# Patient Record
Sex: Female | Born: 1955 | Race: White | Hispanic: No | Marital: Married | Smoking: Current some day smoker
Health system: Southern US, Community
[De-identification: ages and names within clinical notes are randomized; demographics above are authoritative.]

## PROBLEM LIST (undated history)

## (undated) DIAGNOSIS — N83209 Unspecified ovarian cyst, unspecified side: Secondary | ICD-10-CM

## (undated) DIAGNOSIS — F32A Depression, unspecified: Secondary | ICD-10-CM

## (undated) DIAGNOSIS — F329 Major depressive disorder, single episode, unspecified: Secondary | ICD-10-CM

## (undated) DIAGNOSIS — I10 Essential (primary) hypertension: Secondary | ICD-10-CM

## (undated) DIAGNOSIS — N840 Polyp of corpus uteri: Secondary | ICD-10-CM

## (undated) HISTORY — DX: Unspecified ovarian cyst, unspecified side: N83.209

## (undated) HISTORY — PX: COLONOSCOPY: SHX174

## (undated) HISTORY — DX: Polyp of corpus uteri: N84.0

---

## 2009-09-16 DIAGNOSIS — N83209 Unspecified ovarian cyst, unspecified side: Secondary | ICD-10-CM

## 2009-09-16 HISTORY — DX: Unspecified ovarian cyst, unspecified side: N83.209

## 2010-01-16 DIAGNOSIS — N840 Polyp of corpus uteri: Secondary | ICD-10-CM

## 2010-01-16 HISTORY — DX: Polyp of corpus uteri: N84.0

## 2010-01-16 HISTORY — PX: DIAGNOSTIC LAPAROSCOPY: SUR761

## 2011-03-19 ENCOUNTER — Emergency Department: Payer: Self-pay | Admitting: Emergency Medicine

## 2012-02-21 ENCOUNTER — Ambulatory Visit: Payer: Self-pay | Admitting: Family Medicine

## 2013-04-14 ENCOUNTER — Ambulatory Visit: Payer: Self-pay | Admitting: Physician Assistant

## 2015-10-04 ENCOUNTER — Emergency Department
Admission: EM | Admit: 2015-10-04 | Discharge: 2015-10-04 | Disposition: A | Discharge status: Home / Self Care | Payer: BC Managed Care – PPO | Attending: Emergency Medicine | Admitting: Emergency Medicine

## 2015-10-04 ENCOUNTER — Encounter: Payer: Self-pay | Admitting: Emergency Medicine

## 2015-10-04 DIAGNOSIS — R21 Rash and other nonspecific skin eruption: Secondary | ICD-10-CM | POA: Insufficient documentation

## 2015-10-04 DIAGNOSIS — I10 Essential (primary) hypertension: Secondary | ICD-10-CM | POA: Insufficient documentation

## 2015-10-04 DIAGNOSIS — R1030 Lower abdominal pain, unspecified: Secondary | ICD-10-CM | POA: Insufficient documentation

## 2015-10-04 DIAGNOSIS — R51 Headache: Secondary | ICD-10-CM | POA: Insufficient documentation

## 2015-10-04 DIAGNOSIS — R519 Headache, unspecified: Secondary | ICD-10-CM

## 2015-10-04 DIAGNOSIS — R1032 Left lower quadrant pain: Secondary | ICD-10-CM

## 2015-10-04 DIAGNOSIS — R197 Diarrhea, unspecified: Secondary | ICD-10-CM | POA: Insufficient documentation

## 2015-10-04 DIAGNOSIS — R1031 Right lower quadrant pain: Secondary | ICD-10-CM

## 2015-10-04 HISTORY — DX: Depression, unspecified: F32.A

## 2015-10-04 HISTORY — DX: Essential (primary) hypertension: I10

## 2015-10-04 HISTORY — DX: Major depressive disorder, single episode, unspecified: F32.9

## 2015-10-04 LAB — URINALYSIS COMPLETE WITH MICROSCOPIC (ARMC ONLY)
Bacteria, UA: NONE SEEN
Bilirubin Urine: NEGATIVE
GLUCOSE, UA: NEGATIVE mg/dL
KETONES UR: NEGATIVE mg/dL
Leukocytes, UA: NEGATIVE
NITRITE: NEGATIVE
PROTEIN: NEGATIVE mg/dL
SPECIFIC GRAVITY, URINE: 1.017 (ref 1.005–1.030)
pH: 5 (ref 5.0–8.0)

## 2015-10-04 LAB — COMPREHENSIVE METABOLIC PANEL
ALK PHOS: 84 U/L (ref 38–126)
ALT: 40 U/L (ref 14–54)
AST: 30 U/L (ref 15–41)
Albumin: 4.6 g/dL (ref 3.5–5.0)
Anion gap: 9 (ref 5–15)
BILIRUBIN TOTAL: 0.4 mg/dL (ref 0.3–1.2)
BUN: 18 mg/dL (ref 6–20)
CALCIUM: 9.9 mg/dL (ref 8.9–10.3)
CO2: 24 mmol/L (ref 22–32)
Chloride: 105 mmol/L (ref 101–111)
Creatinine, Ser: 0.73 mg/dL (ref 0.44–1.00)
GFR calc Af Amer: >60 mL/min (ref >60)
Glucose, Bld: 114 mg/dL — ABNORMAL HIGH (ref 65–99)
POTASSIUM: 4 mmol/L (ref 3.5–5.1)
Sodium: 138 mmol/L (ref 135–145)
TOTAL PROTEIN: 7.8 g/dL (ref 6.5–8.1)

## 2015-10-04 LAB — CBC
HCT: 45.1 % (ref 35.0–47.0)
Hemoglobin: 15.8 g/dL (ref 12.0–16.0)
MCH: 31 pg (ref 26.0–34.0)
MCHC: 35 g/dL (ref 32.0–36.0)
MCV: 88.5 fL (ref 80.0–100.0)
Platelets: 174 10*3/uL (ref 150–440)
RBC: 5.09 MIL/uL (ref 3.80–5.20)
RDW: 13.2 % (ref 11.5–14.5)
WBC: 7.9 10*3/uL (ref 3.6–11.0)

## 2015-10-04 LAB — LIPASE, BLOOD: Lipase: 36 U/L (ref 11–51)

## 2015-10-04 MED ORDER — DIPHENOXYLATE-ATROPINE 2.5-0.025 MG PO TABS
2.0000 | ORAL_TABLET | Freq: Once | ORAL | Status: AC
  Administered 2015-10-04: 2 via ORAL
  Filled 2015-10-04: qty 2

## 2015-10-04 MED ORDER — DIPHENHYDRAMINE HCL 25 MG PO CAPS: 25.0000 mg | ORAL_CAPSULE | Freq: Four times a day (QID) | ORAL | 0 refills | Status: DC | PRN

## 2015-10-04 MED ORDER — LOPERAMIDE HCL 2 MG PO TABS: 2.0000 mg | ORAL_TABLET | Freq: Four times a day (QID) | ORAL | 0 refills | Status: DC | PRN

## 2015-10-04 NOTE — ED Triage Notes (Signed)
Pt to ed with c/o abd pain and cramping and diarrhea since Friday.

## 2015-10-04 NOTE — ED Provider Notes (Signed)
[facility] Emergency Department Provider Note  ____________________________________________  Time seen: Approximately 4:33 PM  I have reviewed the triage vital signs and the nursing notes.   HISTORY  Chief Complaint Diarrhea    HPI Melinda Randall is a 60 y.o. female presenting w/ diarrhea, lower abdominal cramping, headache and rash.  The patient reports that 4 days ago she developed a large number of watery nonbloody stools associated with abdominal cramping and a headache. The abdominal cramping was in the lower abdomen in both the cramping and headache would resolve after bowel movements. Since then, the frequency of her diarrhea has improved, however she reports that every time she eats or drinks anything, "it runs right through me." She has not tried any antidiarrheal. Over the past 2 days, she is also noted an erythematous and very pruritic rash on the left shoulder under her bra strap. She denies any new detergents, soaps, changes in medications. She has tried calamine lotion without improvement, but Benadryl did help. No fevers, chills, nausea or vomiting. No recent travel outside the [place]. No known sick contacts.  Past Medical History:  Diagnosis Date  . Depression   . Hypertension     There are no active problems to display for this patient.   History reviewed. No pertinent surgical history.  Current Outpatient Rx  . Order #: [id number] Class: Print  . Order #: [id number] Class: Print    Allergies Review of patient's allergies indicates no known allergies.  History reviewed. No pertinent family history.  Social History Social History  Substance Use Topics  . Smoking status: Never Smoker  . Smokeless tobacco: Never Used  . Alcohol use No    Review of Systems Constitutional: No fever/chills. Eyes: No visual changes. ENT: No sore throat. No congestion or rhinorrhea. Cardiovascular: Denies chest pain. Denies  palpitations. Respiratory: Denies shortness of breath.  No cough. Gastrointestinal: Positive lower abdominal pain.  No nausea, no vomiting.  Positive watery diarrhea.  No constipation. Genitourinary: Negative for dysuria. Musculoskeletal: Negative for back pain. Skin: Positive for rash. Neurological: Negative for headaches. No focal numbness, tingling or weakness.   10-point ROS otherwise negative.  ____________________________________________   PHYSICAL EXAM:  VITAL SIGNS: ED Triage Vitals  Enc Vitals Group     BP 10/04/15 1405 (!) 136/99     Pulse Rate 10/04/15 1405 90     Resp 10/04/15 1405 18     Temp 10/04/15 1405 98.4 F (36.9 C)     Temp Source 10/04/15 1405 Oral     SpO2 10/04/15 1405 100 %     Weight 10/04/15 1405 146 lb (66.2 kg)     Height 10/04/15 1405 5\' 3"  (1.6 m)     Head Circumference --      Peak Flow --      Pain Score 10/04/15 1407 3     Pain Loc --      Pain Edu? --      Excl. in GC? --     Constitutional: Alert and oriented. Well appearing and in no acute distress. Answers questions appropriately. Eyes: Conjunctivae are normal.  EOMI. No scleral icterus. Head: Atraumatic. Nose: No congestion/rhinnorhea. Mouth/Throat: Mucous membranes are moist.  Neck: No stridor.  Supple.  No meningismus. Cardiovascular: Normal rate, regular rhythm. No murmurs, rubs or gallops.  Respiratory: Normal respiratory effort.  No accessory muscle use or retractions. Lungs CTAB.  No wheezes, rales or ronchi. Gastrointestinal: Obese. Soft, nontender and nondistended.  No tenderness at this time. No guarding  or rebound.  No peritoneal signs. Musculoskeletal: No LE edema.  Neurologic:  A&Ox3.  Speech is clear.  Face and smile are symmetric.  EOMI.  Moves all extremities well. Skin:  Skin is warm, dry and intact. Patient has an erythematous patch that is 4 x 2" on the left shoulder just below her bra strap without any vesicles, swelling, or discharge. Psychiatric: Mood and  affect are normal. Speech and behavior are normal.  Normal judgement.  ____________________________________________   LABS (all labs ordered are listed, but only abnormal results are displayed)  Labs Reviewed  COMPREHENSIVE METABOLIC PANEL - Abnormal; Notable for the following:       Result Value   Glucose, Bld 114 (*)    All other components within normal limits  URINALYSIS COMPLETEWITH MICROSCOPIC ([facility] ONLY) - Abnormal; Notable for the following:    Color, Urine YELLOW (*)    APPearance CLEAR (*)    Hgb urine dipstick 1+ (*)    Squamous Epithelial / LPF 0-5 (*)    All other components within normal limits  LIPASE, BLOOD  CBC   ____________________________________________  EKG  Not indicated ____________________________________________  RADIOLOGY  No results found.  ____________________________________________   PROCEDURES  Procedure(s) performed: None  Procedures  Critical Care performed: No ____________________________________________   INITIAL IMPRESSION / ASSESSMENT AND PLAN / ED COURSE  Pertinent labs & imaging results that were available during my care of the patient were reviewed by me and considered in my medical decision making (see chart for details).  60 y.o. female with 4 days of watery diarrhea that is improving in frequency, associated abdominal cramping, and rash. Overall, the patient is well-appearing with reassuring vital signs per she is afebrile. Her examination is reassuring and she has no focal pain that would be consistent with an acute surgical pathology or infectious etiology including diverticulitis. It is possible that she has a viral GI illness, or foodborne illness although this is less likely given the length of time that she has been having symptoms. Without fever, focal pain on exam, and normal white blood cell count, there is no indication for imaging at this time. I have given her return precautions. For the patient's rash, the  etiology is not quite clear, and I have recommended that she continue with calamine lotion as she has been using, and had Benadryl for it she noticed.  The patient will follow up with her primary care physician in the next 3-4 days. We will treat with her diarrhea and her rash symptomatically. She understands return precautions as well as follow-up instructions.  ____________________________________________  FINAL CLINICAL IMPRESSION(S) / ED DIAGNOSES  Final diagnoses:  Diarrhea, unspecified type  Bilateral lower abdominal cramping  Rash  Acute nonintractable headache, unspecified headache type    Clinical Course      NEW MEDICATIONS STARTED DURING THIS VISIT:  New Prescriptions   DIPHENHYDRAMINE (BENADRYL) 25 MG CAPSULE    Take 1 capsule (25 mg total) by mouth every 6 (six) hours as needed for itching.   LOPERAMIDE (IMODIUM A-D) 2 MG TABLET    Take 1 tablet (2 mg total) by mouth 4 (four) times daily as needed for diarrhea or loose stools.      Rockne MenghiniAnne-Caroline Victoriana Aziz, MD 10/04/15 [phone]

## 2015-10-04 NOTE — Discharge Instructions (Signed)
For your diarrhea, please follow a bland BRAT diet and drink plenty of fluid.  You may take Loperamide as prescribed.  For your rash, you may take Benadryl as needed.  Continue to monitor your rash and keep it clean and dry.  Return to the emergency department if you develop abdominal pain, especially if it does not resolve after bowel movements, fever, chills, inability to keep down liquids, or any other symptoms concerning to you.

## 2016-02-21 ENCOUNTER — Other Ambulatory Visit: Payer: Self-pay | Admitting: Family Medicine

## 2016-02-21 DIAGNOSIS — Z1231 Encounter for screening mammogram for malignant neoplasm of breast: Secondary | ICD-10-CM

## 2016-03-22 ENCOUNTER — Ambulatory Visit
Admission: RE | Admit: 2016-03-22 | Discharge: 2016-03-22 | Disposition: A | Discharge status: Home / Self Care | Payer: BC Managed Care – PPO | Source: Ambulatory Visit | Attending: Family Medicine | Admitting: Family Medicine

## 2016-03-22 DIAGNOSIS — Z1231 Encounter for screening mammogram for malignant neoplasm of breast: Secondary | ICD-10-CM | POA: Diagnosis present

## 2016-03-29 ENCOUNTER — Inpatient Hospital Stay
Admission: RE | Admit: 2016-03-29 | Discharge: 2016-03-29 | Disposition: A | Discharge status: Home / Self Care | Payer: Self-pay | Source: Ambulatory Visit | Attending: *Deleted | Admitting: *Deleted

## 2016-03-29 ENCOUNTER — Other Ambulatory Visit: Payer: Self-pay | Admitting: *Deleted

## 2016-03-29 DIAGNOSIS — Z9289 Personal history of other medical treatment: Secondary | ICD-10-CM

## 2017-02-19 ENCOUNTER — Encounter: Payer: Self-pay | Admitting: Obstetrics and Gynecology

## 2017-02-19 ENCOUNTER — Ambulatory Visit (INDEPENDENT_AMBULATORY_CARE_PROVIDER_SITE_OTHER): Payer: BC Managed Care – PPO | Admitting: Obstetrics and Gynecology

## 2017-02-19 DIAGNOSIS — Z1231 Encounter for screening mammogram for malignant neoplasm of breast: Secondary | ICD-10-CM

## 2017-02-19 DIAGNOSIS — Z1239 Encounter for other screening for malignant neoplasm of breast: Secondary | ICD-10-CM

## 2017-02-19 DIAGNOSIS — Z124 Encounter for screening for malignant neoplasm of cervix: Secondary | ICD-10-CM

## 2017-02-19 DIAGNOSIS — Z01419 Encounter for gynecological examination (general) (routine) without abnormal findings: Secondary | ICD-10-CM

## 2017-02-19 MED ORDER — NYSTATIN 100000 UNIT/GM EX CREA: 1.0000 "application " | TOPICAL_CREAM | Freq: Two times a day (BID) | CUTANEOUS | 1 refills | Status: AC

## 2017-02-19 NOTE — Patient Instructions (Signed)
Preventive Care 40-64 Years, Female Preventive care refers to lifestyle choices and visits with your health care provider that can promote health and wellness. What does preventive care include?  A yearly physical exam. This is also called an annual well check.  Dental exams once or twice a year.  Routine eye exams. Ask your health care provider how often you should have your eyes checked.  Personal lifestyle choices, including: ? Daily care of your teeth and gums. ? Regular physical activity. ? Eating a healthy diet. ? Avoiding tobacco and drug use. ? Limiting alcohol use. ? Practicing safe sex. ? Taking low-dose aspirin daily starting at age 49. ? Taking vitamin and mineral supplements as recommended by your health care provider. What happens during an annual well check? The services and screenings done by your health care provider during your annual well check will depend on your age, overall health, lifestyle risk factors, and family history of disease. Counseling Your health care provider may ask you questions about your:  Alcohol use.  Tobacco use.  Drug use.  Emotional well-being.  Home and relationship well-being.  Sexual activity.  Eating habits.  Work and work Statistician.  Method of birth control.  Menstrual cycle.  Pregnancy history.  Screening You may have the following tests or measurements:  Height, weight, and BMI.  Blood pressure.  Lipid and cholesterol levels. These may be checked every 5 years, or more frequently if you are over 86 years old.  Skin check.  Lung cancer screening. You may have this screening every year starting at age 69 if you have a 30-pack-year history of smoking and currently smoke or have quit within the past 15 years.  Fecal occult blood test (FOBT) of the stool. You may have this test every year starting at age 29.  Flexible sigmoidoscopy or colonoscopy. You may have a sigmoidoscopy every 5 years or a colonoscopy  every 10 years starting at age 27.  Hepatitis C blood test.  Hepatitis B blood test.  Sexually transmitted disease (STD) testing.  Diabetes screening. This is done by checking your blood sugar (glucose) after you have not eaten for a while (fasting). You may have this done every 1-3 years.  Mammogram. This may be done every 1-2 years. Talk to your health care provider about when you should start having regular mammograms. This may depend on whether you have a family history of breast cancer.  BRCA-related cancer screening. This may be done if you have a family history of breast, ovarian, tubal, or peritoneal cancers.  Pelvic exam and Pap test. This may be done every 3 years starting at age 14. Starting at age 37, this may be done every 5 years if you have a Pap test in combination with an HPV test.  Bone density scan. This is done to screen for osteoporosis. You may have this scan if you are at high risk for osteoporosis.  Discuss your test results, treatment options, and if necessary, the need for more tests with your health care provider. Vaccines Your health care provider may recommend certain vaccines, such as:  Influenza vaccine. This is recommended every year.  Tetanus, diphtheria, and acellular pertussis (Tdap, Td) vaccine. You may need a Td booster every 10 years.  Varicella vaccine. You may need this if you have not been vaccinated.  Zoster vaccine. You may need this after age 35.  Measles, mumps, and rubella (MMR) vaccine. You may need at least one dose of MMR if you were born in  1957 or later. You may also need a second dose.  Pneumococcal 13-valent conjugate (PCV13) vaccine. You may need this if you have certain conditions and were not previously vaccinated.  Pneumococcal polysaccharide (PPSV23) vaccine. You may need one or two doses if you smoke cigarettes or if you have certain conditions.  Meningococcal vaccine. You may need this if you have certain  conditions.  Hepatitis A vaccine. You may need this if you have certain conditions or if you travel or work in places where you may be exposed to hepatitis A.  Hepatitis B vaccine. You may need this if you have certain conditions or if you travel or work in places where you may be exposed to hepatitis B.  Haemophilus influenzae type b (Hib) vaccine. You may need this if you have certain conditions.  Talk to your health care provider about which screenings and vaccines you need and how often you need them. This information is not intended to replace advice given to you by your health care provider. Make sure you discuss any questions you have with your health care provider. Document Released: 01/29/2015 Document Revised: 09/22/2015 Document Reviewed: 11/03/2014 Elsevier Interactive Patient Education  [employer].

## 2017-02-19 NOTE — Progress Notes (Signed)
Gynecology Annual Exam  PCP: Marisue Ivan, MD  Chief Complaint:  Chief Complaint  Patient presents with  . Gynecologic Exam    left breast itching/nipple is discolored    History of Present Illness:Patient is a 62 y.o. G3P2010 presents for annual exam. The patient has no complaints today.   LMP: No LMP recorded. Patient is postmenopausal.   The patient is sexually active. She denies dyspareunia.  The patient does perform self breast exams.  There is no notable family history of breast or ovarian cancer in her family.  The patient wears seatbelts: yes.   The patient has regular exercise: not asked.    The patient denies current symptoms of depression.     Review of Systems: ROS  Past Medical History:  Past Medical History:  Diagnosis Date  . Depression   . Hypertension   . Ovarian cyst 09/2009   Dr. Cathren Harsh  . Uterine polyp 01/2010    Past Surgical History:  Past Surgical History:  Procedure Laterality Date  . COLONOSCOPY  07/01/2008, 2017   [facility]  . DIAGNOSTIC LAPAROSCOPY  01/2010   Uterine Polyp removal    Gynecologic History:  No LMP recorded. Patient is postmenopausal. Last Pap: Results were: 10/24/2012 NIL and HR HPV negative  Last mammogram: 03/22/2016 Results were: BI-RAD I Obstetric History: G3P2010  Family History:  Family History  Problem Relation Age of Onset  . Colon cancer Father 43       again at age 70  . Hypertension Father   . Diabetes Maternal Grandmother   . Liver cancer Other 67  . Pancreatic cancer Other 61    Social History:  Social History   Socioeconomic History  . Marital status: Married    Spouse name: Not on file  . Number of children: Not on file  . Years of education: Not on file  . Highest education level: Not on file  Social Needs  . Financial resource strain: Not on file  . Food insecurity - worry: Not on file  . Food insecurity - inability: Not on file  . Transportation needs - medical: Not on  file  . Transportation needs - non-medical: Not on file  Occupational History  . Not on file  Tobacco Use  . Smoking status: Never Smoker  . Smokeless tobacco: Never Used  Substance and Sexual Activity  . Alcohol use: No  . Drug use: No  . Sexual activity: Yes    Birth control/protection: None  Other Topics Concern  . Not on file  Social History Narrative  . Not on file    Allergies:  Allergies  Allergen Reactions  . Doxycycline Nausea Only  . Metformin Diarrhea, Nausea Only and Nausea And Vomiting  . Prednisone Other (See Comments)    High doses of Prednisone caused light-headness, agitation, and profuse sweating  . Penicillin G Rash    Medications: Prior to Admission medications   Medication Sig Start Date End Date Taking? Authorizing Provider  atorvastatin (LIPITOR) 40 MG tablet TAKE 1 TABLET BY MOUTH EVERY DAY AT NIGHT 01/27/17  Yes [provider]  busPIRone (BUSPAR) 5 MG tablet TAKE 1 TABLET (5 MG TOTAL) BY MOUTH 2 (TWO) TIMES DAILY. 01/25/17  Yes [provider]  loperamide (IMODIUM A-D) 2 MG tablet Take 1 tablet (2 mg total) by mouth 4 (four) times daily as needed for diarrhea or loose stools. 10/04/15  Yes Rockne Menghini, MD  losartan (COZAAR) 100 MG tablet Take 100 mg by  mouth daily. 12/31/16  Yes [provider]  metFORMIN (GLUCOPHAGE-XR) 500 MG 24 hr tablet TAKE 1 TABLET (500 MG TOTAL) BY MOUTH ONCE DAILY TAKE 1 TABLET BY MOUTH ONCE DAILY WITH A MEAL 01/25/17  Yes [provider]  PARoxetine (PAXIL) 30 MG tablet TAKE 1 TABLET (30 MG TOTAL) BY MOUTH ONCE DAILY. 12/18/16  Yes [provider]  diphenhydrAMINE (BENADRYL) 25 mg capsule Take 1 capsule (25 mg total) by mouth every 6 (six) hours as needed for itching. 10/04/15 10/03/16  Rockne MenghiniNorman, Anne-Caroline, MD    Physical Exam Vitals: Blood pressure 128/66, pulse (!) 111, height 5\' 4"  (1.626 m), weight 172 lb (78 kg).  General: NAD HEENT: normocephalic, anicteric Thyroid:  no enlargement, no palpable nodules Pulmonary: No increased work of breathing, CTAB Cardiovascular: RRR, distal pulses 2+ Breast: Breast symmetrical, no tenderness, no palpable nodules or masses, no skin or nipple retraction present, no nipple discharge.  No axillary or supraclavicular lymphadenopathy. Abdomen: NABS, soft, non-tender, non-distended.  Umbilicus without lesions.  No hepatomegaly, splenomegaly or masses palpable. No evidence of hernia  Genitourinary:  External: Normal external female genitalia.  Normal urethral meatus, normal  Bartholin's and Skene's glands.    Vagina: Normal vaginal mucosa, no evidence of prolapse.    Cervix: Grossly normal in appearance, no bleeding  Uterus: Non-enlarged, mobile, normal contour.  No CMT  Adnexa: ovaries non-enlarged, no adnexal masses  Rectal: deferred  Lymphatic: no evidence of inguinal lymphadenopathy Extremities: no edema, erythema, or tenderness Neurologic: Grossly intact Psychiatric: mood appropriate, affect full  Female chaperone present for pelvic and breast  portions of the physical exam     Assessment: 62 y.o. G3P2010 routine annual exam  Plan: Problem List Items Addressed This Visit    None    Visit Diagnoses    Screening for malignant neoplasm of cervix       Relevant Orders   PapIG, HPV, rfx 16/18   Breast screening       Relevant Orders   MM DIGITAL SCREENING BILATERAL   Encounter for gynecological examination without abnormal finding       Relevant Orders   MM DIGITAL SCREENING BILATERAL   PapIG, HPV, rfx 16/18      1) Mammogram - recommend yearly screening mammogram.  Mammogram Is up to date - ordered for March  2) STI screening was not offered  3) ASCCP guidelines and rational discussed.  Patient opts for every 3 years screening interval  4) Osteoporosis  - per USPTF routine screening DEXA at age 62 - Maternal history of hip fracture   5) Routine healthcare maintenance including cholesterol,  diabetes screening discussed managed by PCP  6) Colonoscopy up to date age 62  7) Follow up 1 year for routine annual

## 2017-02-21 ENCOUNTER — Encounter: Payer: Self-pay | Admitting: Obstetrics and Gynecology

## 2017-02-21 LAB — PAPIG, HPV, RFX 16/18
HPV, high-risk: NEGATIVE
PAP SMEAR COMMENT: 0

## 2017-03-26 ENCOUNTER — Emergency Department: Payer: BC Managed Care – PPO

## 2017-03-26 ENCOUNTER — Emergency Department
Admission: EM | Admit: 2017-03-26 | Discharge: 2017-03-26 | Disposition: A | Discharge status: Home / Self Care | Payer: BC Managed Care – PPO | Attending: Emergency Medicine | Admitting: Emergency Medicine

## 2017-03-26 ENCOUNTER — Encounter: Payer: Self-pay | Admitting: Emergency Medicine

## 2017-03-26 ENCOUNTER — Other Ambulatory Visit: Payer: Self-pay

## 2017-03-26 DIAGNOSIS — Z79899 Other long term (current) drug therapy: Secondary | ICD-10-CM | POA: Diagnosis not present

## 2017-03-26 DIAGNOSIS — R0789 Other chest pain: Secondary | ICD-10-CM | POA: Insufficient documentation

## 2017-03-26 DIAGNOSIS — I10 Essential (primary) hypertension: Secondary | ICD-10-CM | POA: Diagnosis not present

## 2017-03-26 DIAGNOSIS — R079 Chest pain, unspecified: Secondary | ICD-10-CM

## 2017-03-26 DIAGNOSIS — F17200 Nicotine dependence, unspecified, uncomplicated: Secondary | ICD-10-CM | POA: Insufficient documentation

## 2017-03-26 DIAGNOSIS — Z7984 Long term (current) use of oral hypoglycemic drugs: Secondary | ICD-10-CM | POA: Insufficient documentation

## 2017-03-26 LAB — TROPONIN I
Troponin I: <0.03 ng/mL (ref <0.03)
Troponin I: <0.03 ng/mL (ref <0.03)

## 2017-03-26 LAB — COMPREHENSIVE METABOLIC PANEL
ALBUMIN: 3.8 g/dL (ref 3.5–5.0)
ALK PHOS: 75 U/L (ref 38–126)
ALT: 23 U/L (ref 14–54)
AST: 20 U/L (ref 15–41)
Anion gap: 9 (ref 5–15)
BUN: 23 mg/dL — ABNORMAL HIGH (ref 6–20)
CALCIUM: 8.8 mg/dL — AB (ref 8.9–10.3)
CHLORIDE: 105 mmol/L (ref 101–111)
CO2: 23 mmol/L (ref 22–32)
Creatinine, Ser: 0.57 mg/dL (ref 0.44–1.00)
GFR calc Af Amer: >60 mL/min (ref >60)
GFR calc non Af Amer: >60 mL/min (ref >60)
GLUCOSE: 137 mg/dL — AB (ref 65–99)
POTASSIUM: 3.5 mmol/L (ref 3.5–5.1)
SODIUM: 137 mmol/L (ref 135–145)
Total Bilirubin: 1.1 mg/dL (ref 0.3–1.2)
Total Protein: 6.5 g/dL (ref 6.5–8.1)

## 2017-03-26 LAB — CBC
HCT: 40.8 % (ref 35.0–47.0)
HEMOGLOBIN: 13.7 g/dL (ref 12.0–16.0)
MCH: 30 pg (ref 26.0–34.0)
MCHC: 33.6 g/dL (ref 32.0–36.0)
MCV: 89.3 fL (ref 80.0–100.0)
PLATELETS: 161 10*3/uL (ref 150–440)
RBC: 4.57 MIL/uL (ref 3.80–5.20)
RDW: 12.9 % (ref 11.5–14.5)
WBC: 10 10*3/uL (ref 3.6–11.0)

## 2017-03-26 MED ORDER — GI COCKTAIL ~~LOC~~
30.0000 mL | Freq: Once | ORAL | Status: AC
  Administered 2017-03-26: 30 mL via ORAL
  Filled 2017-03-26: qty 30

## 2017-03-26 MED ORDER — NITROGLYCERIN 0.4 MG SL SUBL
0.4000 mg | SUBLINGUAL_TABLET | SUBLINGUAL | Status: DC | PRN
  Administered 2017-03-26: 0.4 mg via SUBLINGUAL
  Filled 2017-03-26: qty 1

## 2017-03-26 MED ORDER — FAMOTIDINE 40 MG PO TABS: 40.0000 mg | ORAL_TABLET | Freq: Every evening | ORAL | 0 refills | Status: AC

## 2017-03-26 NOTE — ED Triage Notes (Signed)
Pt states sudden onset of sharp mid sternal to epigastric pain with emesis, shob and chills 2230. Pt states she did have dull epigastric pain that began at 1600 yesterday.

## 2017-03-26 NOTE — ED Notes (Signed)
Pt assisted up to restroom

## 2017-03-26 NOTE — ED Notes (Signed)
Pt remains pain free. Pt updated on care plan, pt verbalizes understanding.

## 2017-03-26 NOTE — ED Triage Notes (Signed)
Pt took 324mg  of asa per ems

## 2017-03-26 NOTE — Discharge Instructions (Signed)
The evaluation for your chest pain has been negative thus far.  Your pain is also improved at this time.  Please follow-up with cardiology for further evaluation including a stress test and any other testing they feel appropriate.  Please return to the emergency department should the pain return or you have any other concerns.

## 2017-03-26 NOTE — ED Provider Notes (Signed)
[facility] Emergency Department Provider Note   ____________________________________________   First MD Initiated Contact with Patient 03/26/17 0038     (approximate)  I have reviewed the triage vital signs and the nursing notes.   HISTORY  Chief Complaint Chest Pain    HPI Melinda Randall is a 62 y.o. female who comes into the hospital today with some chest pain.  She reports that it started as a dull ache around 4 PM.  It became progressively worse and became sharp around 10.  The patient tried laying down but states that it made it worse.  Around 11pm the patient was trying to walk around to see if it would go away but it did not get better so she decided to come in and get checked out.  The patient also states that she started vomiting.  The pain is in between the patient's breast and does not radiate.  She has had some shortness of breath but no dizziness lightheadedness or sweats.  The patient has never had this before.  She reports that she was getting things out of a high cabinet when this started.  The patient was given aspirin by EMS but no nitroglycerin.  She rates her pain a 6 out of 10 in intensity currently.  The patient is here for evaluation.   Past Medical History:  Diagnosis Date  . Depression   . Hypertension   . Ovarian cyst 09/2009   Dr. Cathren Harsh  . Uterine polyp 01/2010    There are no active problems to display for this patient.   Past Surgical History:  Procedure Laterality Date  . COLONOSCOPY  07/01/2008, 2017   [facility]  . DIAGNOSTIC LAPAROSCOPY  01/2010   Uterine Polyp removal    Prior to Admission medications   Medication Sig Start Date End Date Taking? Authorizing Provider  atorvastatin (LIPITOR) 40 MG tablet TAKE 1 TABLET BY MOUTH EVERY DAY AT NIGHT 01/27/17   [provider]  busPIRone (BUSPAR) 5 MG tablet TAKE 1 TABLET (5 MG TOTAL) BY MOUTH 2 (TWO) TIMES DAILY. 01/25/17   [provider]    diphenhydrAMINE (BENADRYL) 25 mg capsule Take 1 capsule (25 mg total) by mouth every 6 (six) hours as needed for itching. 10/04/15 10/03/16  Rockne Menghini, MD  famotidine (PEPCID) 40 MG tablet Take 1 tablet (40 mg total) by mouth every evening for 15 days. 03/26/17 04/10/17  Rebecka Apley, MD  loperamide (IMODIUM A-D) 2 MG tablet Take 1 tablet (2 mg total) by mouth 4 (four) times daily as needed for diarrhea or loose stools. 10/04/15   Rockne Menghini, MD  losartan (COZAAR) 100 MG tablet Take 100 mg by mouth daily. 12/31/16   [provider]  metFORMIN (GLUCOPHAGE-XR) 500 MG 24 hr tablet TAKE 1 TABLET (500 MG TOTAL) BY MOUTH ONCE DAILY TAKE 1 TABLET BY MOUTH ONCE DAILY WITH A MEAL 01/25/17   [provider]  nystatin cream (MYCOSTATIN) Apply 1 application topically 2 (two) times daily. 02/19/17   Vena Austria, MD  PARoxetine (PAXIL) 30 MG tablet TAKE 1 TABLET (30 MG TOTAL) BY MOUTH ONCE DAILY. 12/18/16   [provider]    Allergies Doxycycline; Metformin; Prednisone; and Penicillin g  Family History  Problem Relation Age of Onset  . Colon cancer Father 75       again at age 74  . Hypertension Father   . Diabetes Maternal Grandmother   . Liver cancer Other 67  . Pancreatic cancer  Other 2869    Social History Social History   Tobacco Use  . Smoking status: Current Some Day Smoker  . Smokeless tobacco: Never Used  Substance Use Topics  . Alcohol use: No  . Drug use: No    Review of Systems  Constitutional: No fever/chills Eyes: No visual changes. ENT: No sore throat. Cardiovascular: chest pain. Respiratory: shortness of breath. Gastrointestinal:  nausea, vomiting.  No diarrhea.  No constipation. Genitourinary: Negative for dysuria. Musculoskeletal: Negative for back pain. Skin: Negative for rash. Neurological: Negative for headaches, focal weakness or numbness.   ____________________________________________   PHYSICAL  EXAM:  VITAL SIGNS: ED Triage Vitals [03/26/17 0047]  Enc Vitals Group     BP 133/70     Pulse Rate 83     Resp 16     Temp 98.4 F (36.9 C)     Temp Source Oral     SpO2 100 %     Weight 164 lb (74.4 kg)     Height 5\' 3"  (1.6 m)     Head Circumference      Peak Flow      Pain Score 6     Pain Loc      Pain Edu?      Excl. in GC?     Constitutional: Alert and oriented. Well appearing and in moderate distress. Eyes: Conjunctivae are normal. PERRL. EOMI. Head: Atraumatic. Nose: No congestion/rhinnorhea. Mouth/Throat: Mucous membranes are moist.  Oropharynx non-erythematous. Cardiovascular: Normal rate, regular rhythm. Grossly normal heart sounds.  Good peripheral circulation. Respiratory: Normal respiratory effort.  No retractions. Lungs CTAB. Gastrointestinal: Soft and nontender. No distention.  Musculoskeletal: No lower extremity tenderness nor edema.   Neurologic:  Normal speech and language.  Skin:  Skin is warm, dry and intact.  Psychiatric: Mood and affect are normal.   ____________________________________________   LABS (all labs ordered are listed, but only abnormal results are displayed)  Labs Reviewed  COMPREHENSIVE METABOLIC PANEL - Abnormal; Notable for the following components:      Result Value   Glucose, Bld 137 (*)    BUN 23 (*)    Calcium 8.8 (*)    All other components within normal limits  CBC  TROPONIN I  TROPONIN I   ____________________________________________  EKG  ED ECG REPORT I, Rebecka ApleyWebster,  Brittanya Winburn P, the attending physician, personally viewed and interpreted this ECG.   Date: 03/26/2017  EKG Time: 0040  Rate: 85  Rhythm: normal sinus rhythm  Axis: normal  Intervals:none  ST&T Change: none  ____________________________________________  RADIOLOGY  ED MD interpretation:  CXR  Official radiology report(s): Dg Chest 2 View  Result Date: 03/26/2017 CLINICAL DATA:  62 year old female with chest pain. EXAM: CHEST - 2 VIEW  COMPARISON:  None. FINDINGS: The heart size and mediastinal contours are within normal limits. Both lungs are clear. The visualized skeletal structures are unremarkable. IMPRESSION: No active cardiopulmonary disease. Electronically Signed   By: Elgie CollardArash  Radparvar M.D.   On: 03/26/2017 02:02    ____________________________________________   PROCEDURES  Procedure(s) performed: None  Procedures  Critical Care performed: No  ____________________________________________   INITIAL IMPRESSION / ASSESSMENT AND PLAN / ED COURSE  As part of my medical decision making, I reviewed the following data within the electronic MEDICAL RECORD NUMBER Notes from prior visits and [place] Controlled Substance Database   This is a 62 year old female who comes into the hospital today with some chest pain.  The patient has been having this pain since about 4 PM.  Differential diagnosis includes acute coronary syndrome, gastritis, esophagitis, pneumonia, costochondritis.  I did check a CBC CMP and a troponin.  I also will send the patient for chest x-ray.  I gave the patient a dose of nitroglycerin as well as a GI cocktail.  She will be reassessed once I received some of her blood work results.    The patient had 2 troponins which were negative.  Her pain did improve after the nitroglycerin and GI cocktail.  I discussed with the patient that I feel she needs to follow-up with cardiology for a stress test.  The patient understands the plan.  I will also send her home with some Pepcid in the event that some of her symptoms are due to gastritis.  The patient agrees to the plan to be discharged and follow-up with cardiology.  The patient has no further concerns at this time.  ____________________________________________   FINAL CLINICAL IMPRESSION(S) / ED DIAGNOSES  Final diagnoses:  Chest pain, unspecified type     ED Discharge Orders        Ordered    famotidine (PEPCID) 40 MG tablet  Every evening     03/26/17  0437       Note:  This document was prepared using Dragon voice recognition software and may include unintentional dictation errors.    Rebecka Apley, MD 03/26/17 [id number]

## 2017-03-27 ENCOUNTER — Telehealth: Payer: Self-pay

## 2017-03-27 NOTE — Telephone Encounter (Signed)
Lmov for patient to call back to schedule ED fu appointment from 03/26/17 seen for CP °Will try again at a later time °

## 2017-03-28 ENCOUNTER — Ambulatory Visit (INDEPENDENT_AMBULATORY_CARE_PROVIDER_SITE_OTHER): Payer: BC Managed Care – PPO | Admitting: Internal Medicine

## 2017-03-28 ENCOUNTER — Encounter: Payer: Self-pay | Admitting: Internal Medicine

## 2017-03-28 VITALS — BP 120/72 | HR 90 | Ht 63.5 in | Wt 169.0 lb

## 2017-03-28 DIAGNOSIS — I10 Essential (primary) hypertension: Secondary | ICD-10-CM

## 2017-03-28 DIAGNOSIS — E785 Hyperlipidemia, unspecified: Secondary | ICD-10-CM

## 2017-03-28 DIAGNOSIS — R0789 Other chest pain: Secondary | ICD-10-CM | POA: Diagnosis not present

## 2017-03-28 DIAGNOSIS — I808 Phlebitis and thrombophlebitis of other sites: Secondary | ICD-10-CM

## 2017-03-28 MED ORDER — ASPIRIN EC 81 MG PO TBEC: 81.0000 mg | DELAYED_RELEASE_TABLET | Freq: Every day | ORAL | 3 refills | Status: AC

## 2017-03-28 NOTE — Patient Instructions (Addendum)
Medication Instructions:  Your physician has recommended you make the following change in your medication:  1- START Aspirin 81 mg by mouth once a day.   Labwork: none  Testing/Procedures: Your physician has requested that you have a lexiscan myoview. For further information please visit [email or web address] Please follow instruction sheet, as given.  [facility] LEXISCAN MYOVIEW  Your caregiver has ordered a Stress Test with nuclear imaging. The purpose of this test is to evaluate the blood supply to your heart muscle. This procedure is referred to as a "Non-Invasive Stress Test." This is because other than having an IV started in your vein, nothing is inserted or "invades" your body. Cardiac stress tests are done to find areas of poor blood flow to the heart by determining the extent of coronary artery disease (CAD). Some patients exercise on a treadmill, which naturally increases the blood flow to your heart, while others who are  unable to walk on a treadmill due to physical limitations have a pharmacologic/chemical stress agent called Lexiscan . This medicine will mimic walking on a treadmill by temporarily increasing your coronary blood flow.   Please note: these test may take anywhere between 2-4 hours to complete  PLEASE REPORT TO [facility] MEDICAL MALL ENTRANCE  THE VOLUNTEERS AT THE FIRST DESK WILL DIRECT YOU WHERE TO GO  Date of Procedure:_____________________________________  Arrival Time for Procedure:______________________________   PLEASE NOTIFY THE OFFICE AT LEAST 24 HOURS IN ADVANCE IF YOU ARE UNABLE TO KEEP YOUR APPOINTMENT.  [phone] AND  PLEASE NOTIFY NUCLEAR MEDICINE AT [facility] AT LEAST 24 HOURS IN ADVANCE IF YOU ARE UNABLE TO KEEP YOUR APPOINTMENT. [phone]  How to prepare for your Myoview test:  1. Do not eat or drink after midnight 2. No caffeine for 24 hours prior to test 3. No smoking 24 hours prior to test. 4. Your medication may be taken with water.  If your  doctor stopped a medication because of this test, do not take that medication. 5. Ladies, please do not wear dresses.  Skirts or pants are appropriate. Please wear a short sleeve shirt. 6. No perfume, cologne or lotion. 7. Wear comfortable walking shoes. No heels!    Follow-Up: Your physician recommends that you schedule a follow-up appointment in: 3-4 WEEKS WITH CHRIS OR RYAN.  APPLY WARM COMPRESS TO YOU IV SITE.   If you need a refill on your cardiac medications before your next appointment, please call your pharmacy.   Cardiac Nuclear Scan A cardiac nuclear scan is a test that measures blood flow to the heart when a person is resting and when he or she is exercising. The test looks for problems such as:  Not enough blood reaching a portion of the heart.  The heart muscle not working normally.  You may need this test if:  You have heart disease.  You have had abnormal lab results.  You have had heart surgery or angioplasty.  You have chest pain.  You have shortness of breath.  In this test, a radioactive dye (tracer) is injected into your bloodstream. After the tracer has traveled to your heart, an imaging device is used to measure how much of the tracer is absorbed by or distributed to various areas of your heart. This procedure is usually done at a hospital and takes 2-4 hours. Tell a health care provider about:  Any allergies you have.  All medicines you are taking, including vitamins, herbs, eye drops, creams, and over-the-counter medicines.  Any problems you or family members have  had with the use of anesthetic medicines.  Any blood disorders you have.  Any surgeries you have had.  Any medical conditions you have.  Whether you are pregnant or may be pregnant. What are the risks? Generally, this is a safe procedure. However, problems may occur, including:  Serious chest pain and heart attack. This is only a risk if the stress portion of the test is  done.  Rapid heartbeat.  Sensation of warmth in your chest. This usually passes quickly.  What happens before the procedure?  Ask your health care provider about changing or stopping your regular medicines. This is especially important if you are taking diabetes medicines or blood thinners.  Remove your jewelry on the day of the procedure. What happens during the procedure?  An IV tube will be inserted into one of your veins.  Your health care provider will inject a small amount of radioactive tracer through the tube.  You will wait for 20-40 minutes while the tracer travels through your bloodstream.  Your heart activity will be monitored with an electrocardiogram (ECG).  You will lie down on an exam table.  Images of your heart will be taken for about 15-20 minutes.  You may be asked to exercise on a treadmill or stationary bike. While you exercise, your heart's activity will be monitored with an ECG, and your blood pressure will be checked. If you are unable to exercise, you may be given a medicine to increase blood flow to parts of your heart.  When blood flow to your heart has peaked, a tracer will again be injected through the IV tube.  After 20-40 minutes, you will get back on the exam table and have more images taken of your heart.  When the procedure is over, your IV tube will be removed. The procedure may vary among health care providers and hospitals. Depending on the type of tracer used, scans may need to be repeated 3-4 hours later. What happens after the procedure?  Unless your health care provider tells you otherwise, you may return to your normal schedule, including diet, activities, and medicines.  Unless your health care provider tells you otherwise, you may increase your fluid intake. This will help flush the contrast dye from your body. Drink enough fluid to keep your urine clear or pale yellow.  It is up to you to get your test results. Ask your health care  provider, or the department that is doing the test, when your results will be ready. Summary  A cardiac nuclear scan measures the blood flow to the heart when a person is resting and when he or she is exercising.  You may need this test if you are at risk for heart disease.  Tell your health care provider if you are pregnant.  Unless your health care provider tells you otherwise, increase your fluid intake. This will help flush the contrast dye from your body. Drink enough fluid to keep your urine clear or pale yellow. This information is not intended to replace advice given to you by your health care provider. Make sure you discuss any questions you have with your health care provider. Document Released: 01/28/2004 Document Revised: 01/05/2016 Document Reviewed: 12/11/2012 Elsevier Interactive Patient Education  2017 [employer]

## 2017-03-28 NOTE — Progress Notes (Signed)
New Outpatient Visit Date: 03/28/2017  Referring Provider: Marisue Ivan, MD [id number] [facility] [address] [facility] [place], [place] [address]  Chief Complaint: Chest pain  HPI:  Ms. Meisinger is a 62 y.o. female who is being seen today for the evaluation of chest pain following ED visit 2 days ago. She has a history of hypertension, hyperlipidemia, depression, and ovarian cyst. Ms. Ertle presented to the ED on 03/26/17 with several hours of chest pain worsened by lying down. She also developed emesis. The pain did not improve with NTG administered by EMS. EKG showed low voltage but otherwise no significant abnormalities. TnI was negative x 2.  Ms. Ewald notes she had not been feeling like her usual self all day.  She developed dull pain across her chest before going to bed and then woke up with severe substernal chest pain that felt as though she were being stabbed.  She became nauseated and "vomited all over the bedroom."  Her husband subsequently called 911.  In the emergency department, the pain gradually resolved after Ms. Lendell Caprice received sublingual nitroglycerin and a GI cocktail.  She has not had any further episodes of chest pain since then.  There are no clear precipitants for her episode 2 days ago.  However, she notes cutting down a magnolia tree by hand 1 week earlier.  Ms. Passmore denies a history of prior cardiac disease.  She reports one episode of syncope several years ago.  Neurologic workup at that time was reportedly negative.  She has intermittently experienced "butterflies" in her chest with negative EKGs during the episodes.  She has chronic sinus congestion and has also recently noticed mild shortness of breath.  She denies orthopnea and PND, though she typically sleeps on an incline as this helps with her sinus congestion.  She has a history of GERD but feels like her recent episode of chest pain was different than what she has previously felt with acid  reflux.  --------------------------------------------------------------------------------------------------  Cardiovascular History & Procedures: Cardiovascular Problems:  Chest pain  Risk Factors:  HTN, HLD, tobacco use, and obesity  Cath/PCI:  None  CV Surgery:  None  EP Procedures and Devices:  None  Non-Invasive Evaluation(s):  None  Recent CV Pertinent Labs: Lab Results  Component Value Date   K 3.5 03/26/2017   BUN 23 (H) 03/26/2017   CREATININE 0.57 03/26/2017    --------------------------------------------------------------------------------------------------  Past Medical History:  Diagnosis Date  . Depression   . Hypertension   . Ovarian cyst 09/2009   Dr. Cathren Harsh  . Uterine polyp 01/2010    Past Surgical History:  Procedure Laterality Date  . COLONOSCOPY  07/01/2008, 2017   [facility]  . DIAGNOSTIC LAPAROSCOPY  01/2010   Uterine Polyp removal    Current Meds  Medication Sig  . atorvastatin (LIPITOR) 40 MG tablet TAKE 1 TABLET BY MOUTH EVERY DAY AT NIGHT  . busPIRone (BUSPAR) 5 MG tablet TAKE 1 TABLET (5 MG TOTAL) BY MOUTH 2 (TWO) TIMES DAILY.  . famotidine (PEPCID) 40 MG tablet Take 1 tablet (40 mg total) by mouth every evening for 15 days.  Marland Kitchen losartan (COZAAR) 100 MG tablet Take 100 mg by mouth daily.  Marland Kitchen PARoxetine (PAXIL) 30 MG tablet TAKE 1 TABLET (30 MG TOTAL) BY MOUTH ONCE DAILY.    Allergies: Doxycycline; Metformin; Prednisone; and Penicillin g  Social History   Socioeconomic History  . Marital status: Married    Spouse name: Not on file  . Number of children: Not on  file  . Years of education: Not on file  . Highest education level: Not on file  Social Needs  . Financial resource strain: Not on file  . Food insecurity - worry: Not on file  . Food insecurity - inability: Not on file  . Transportation needs - medical: Not on file  . Transportation needs - non-medical: Not on file  Occupational History  . Not on file   Tobacco Use  . Smoking status: Current Some Day Smoker    Packs/day: 0.25    Years: 12.00    Pack years: 3.00  . Smokeless tobacco: Never Used  Substance and Sexual Activity  . Alcohol use: No  . Drug use: No  . Sexual activity: Yes    Birth control/protection: None  Other Topics Concern  . Not on file  Social History Narrative  . Not on file    Family History  Problem Relation Age of Onset  . Colon cancer Father 28       again at age 61  . Hypertension Father   . Diabetes Maternal Grandmother   . Liver cancer Other 67  . Pancreatic cancer Other 69  . Dementia Mother   . Healthy Brother   . Heart attack Maternal Grandfather 79    Review of Systems: Ms. Salley notes an area of firmness and discomfort in the left forearm at the site of an IV during her recent ED visit.  Otherwise, a 12-system review of systems was performed and was negative except as noted in the HPI.  --------------------------------------------------------------------------------------------------  Physical Exam: BP 120/72 (BP Location: Left Arm, Patient Position: Sitting, Cuff Size: Normal)   Pulse 90   Ht 5' 3.5" (1.613 m)   Wt 169 lb (76.7 kg)   BMI 29.47 kg/m   General: Overweight woman, seated comfortably in the exam room. HEENT: No conjunctival pallor or scleral icterus. Moist mucous membranes. OP clear. Neck: Supple without lymphadenopathy, thyromegaly, JVD, or HJR. No carotid bruit. Lungs: Normal work of breathing. Clear to auscultation bilaterally without wheezes or crackles. Heart: Regular rate and rhythm without murmurs, rubs, or gallops. Non-displaced PMI. Abd: Bowel sounds present. Soft, NT/ND without hepatosplenomegaly Ext: No lower extremity edema. Radial, PT, and DP pulses are 2+ bilaterally Skin: Warm and dry mild erythema along the dorsum of the left forearm with small area of firmness that may represent phlebitis. Neuro: CNIII-XII intact. Strength and fine-touch sensation  intact in upper and lower extremities bilaterally. Psych: Normal mood and affect.  EKG:  NSR without abnormalities.  Lab Results  Component Value Date   WBC 10.0 03/26/2017   HGB 13.7 03/26/2017   HCT 40.8 03/26/2017   MCV 89.3 03/26/2017   PLT 161 03/26/2017    Lab Results  Component Value Date   NA 137 03/26/2017   K 3.5 03/26/2017   CL 105 03/26/2017   CO2 23 03/26/2017   BUN 23 (H) 03/26/2017   CREATININE 0.57 03/26/2017   GLUCOSE 137 (H) 03/26/2017   ALT 23 03/26/2017    Outside lipid panel (03/08/17): Total cholesterol 159, triglyceride 113, HDL 49, LDL 87  --------------------------------------------------------------------------------------------------  ASSESSMENT AND PLAN: Atypical chest pain Chest pain began at rest and resolved after Ms. Hitt received both nitroglycerin and a GI cocktail.  She has not had any further episodes of chest pain but describes a long history of "butterflies" in her chest as well as some more recent mild dyspnea.  EKG and exam today are unrevealing.  In addition, recent ED workup  was normal.  We have agreed to perform a pharmacologic myocardial perfusion stress test, as Ms. Lendell CapriceSullivan is unable to ambulate well due to a recent left ankle injury.  In the meantime, I have asked her to begin taking aspirin 81 mg daily.  She should continue with the famotidine that was prescribed at her recent ED visit.  Thrombophlebitis of the left forearm Small area of thrombophlebitis noted at site of prior IV insertion in the left forearm.  I have advised Ms. Lendell CapriceSullivan to begin taking low-dose aspirin as well as to apply warm compress.  If pain or erythema worsen, she should follow-up with her PCP for further assessment.  Essential hypertension Blood pressure well controlled.  No medication changes at this time.  Hyperlipidemia Lipid panel on 03/08/17 revealed an LDL of 87.  If there is evidence of atherosclerotic cardiovascular disease on planned testing,  escalation of statin therapy will need to be considered to achieve a goal LDL of less than 70.  In the meantime, Ms. Lendell CapriceSullivan can continue with atorvastatin 40 mg daily.  Follow-up: Return to clinic in 3-4 weeks.  Yvonne Kendallhristopher Lulie Hurd, MD 03/28/2017 2:34 PM

## 2017-03-30 DIAGNOSIS — R0789 Other chest pain: Secondary | ICD-10-CM | POA: Insufficient documentation

## 2017-03-30 DIAGNOSIS — I808 Phlebitis and thrombophlebitis of other sites: Secondary | ICD-10-CM | POA: Insufficient documentation

## 2017-03-30 DIAGNOSIS — E785 Hyperlipidemia, unspecified: Secondary | ICD-10-CM | POA: Insufficient documentation

## 2017-03-30 DIAGNOSIS — I10 Essential (primary) hypertension: Secondary | ICD-10-CM | POA: Insufficient documentation

## 2017-04-03 ENCOUNTER — Encounter
Admission: RE | Admit: 2017-04-03 | Discharge: 2017-04-03 | Disposition: A | Discharge status: Home / Self Care | Payer: BC Managed Care – PPO | Source: Ambulatory Visit | Attending: Internal Medicine | Admitting: Internal Medicine

## 2017-04-03 DIAGNOSIS — R0789 Other chest pain: Secondary | ICD-10-CM | POA: Diagnosis present

## 2017-04-03 LAB — NM MYOCAR MULTI W/SPECT W/WALL MOTION / EF
CSEPHR: 70 %
CSEPPHR: 112 {beats}/min
LV dias vol: 28 mL (ref 46–106)
LV sys vol: 3 mL
NUC STRESS TID: 0.71
Rest HR: 81 {beats}/min

## 2017-04-03 MED ORDER — TECHNETIUM TC 99M TETROFOSMIN IV KIT
13.0000 | PACK | Freq: Once | INTRAVENOUS | Status: AC | PRN
  Administered 2017-04-03: 14.018 via INTRAVENOUS

## 2017-04-03 MED ORDER — REGADENOSON 0.4 MG/5ML IV SOLN
0.4000 mg | Freq: Once | INTRAVENOUS | Status: AC
  Administered 2017-04-03: 0.4 mg via INTRAVENOUS

## 2017-04-03 MED ORDER — TECHNETIUM TC 99M TETROFOSMIN IV KIT
32.3900 | PACK | Freq: Once | INTRAVENOUS | Status: AC | PRN
  Administered 2017-04-03: 32.39 via INTRAVENOUS

## 2017-04-26 ENCOUNTER — Ambulatory Visit: Admitting: Nurse Practitioner

## 2017-06-18 ENCOUNTER — Ambulatory Visit
Admission: RE | Admit: 2017-06-18 | Discharge: 2017-06-18 | Disposition: A | Discharge status: Home / Self Care | Payer: BC Managed Care – PPO | Source: Ambulatory Visit | Attending: Obstetrics and Gynecology | Admitting: Obstetrics and Gynecology

## 2017-06-18 ENCOUNTER — Encounter (INDEPENDENT_AMBULATORY_CARE_PROVIDER_SITE_OTHER): Payer: Self-pay

## 2017-06-18 DIAGNOSIS — Z01419 Encounter for gynecological examination (general) (routine) without abnormal findings: Secondary | ICD-10-CM | POA: Insufficient documentation

## 2017-06-18 DIAGNOSIS — Z1231 Encounter for screening mammogram for malignant neoplasm of breast: Secondary | ICD-10-CM | POA: Diagnosis present

## 2017-06-18 DIAGNOSIS — Z1239 Encounter for other screening for malignant neoplasm of breast: Secondary | ICD-10-CM

## 2019-01-22 ENCOUNTER — Other Ambulatory Visit: Payer: Self-pay | Admitting: Family Medicine

## 2019-01-22 DIAGNOSIS — Z1231 Encounter for screening mammogram for malignant neoplasm of breast: Secondary | ICD-10-CM

## 2019-02-19 ENCOUNTER — Encounter

## 2019-05-12 ENCOUNTER — Ambulatory Visit
Admission: RE | Admit: 2019-05-12 | Discharge: 2019-05-12 | Disposition: A | Discharge status: Home / Self Care | Payer: BC Managed Care – PPO | Source: Ambulatory Visit | Attending: Family Medicine | Admitting: Family Medicine

## 2019-05-12 DIAGNOSIS — Z1231 Encounter for screening mammogram for malignant neoplasm of breast: Secondary | ICD-10-CM | POA: Diagnosis not present

## 2022-01-05 IMAGING — MG DIGITAL SCREENING BILAT W/ TOMO W/ CAD
6 of 10 series · 6 of 30 positions shown · non-contrast
Comparison: Previous exam(s).

CLINICAL DATA: Screening.

EXAM:
DIGITAL SCREENING BILATERAL MAMMOGRAM WITH TOMO AND CAD

[R CC synth-2D]
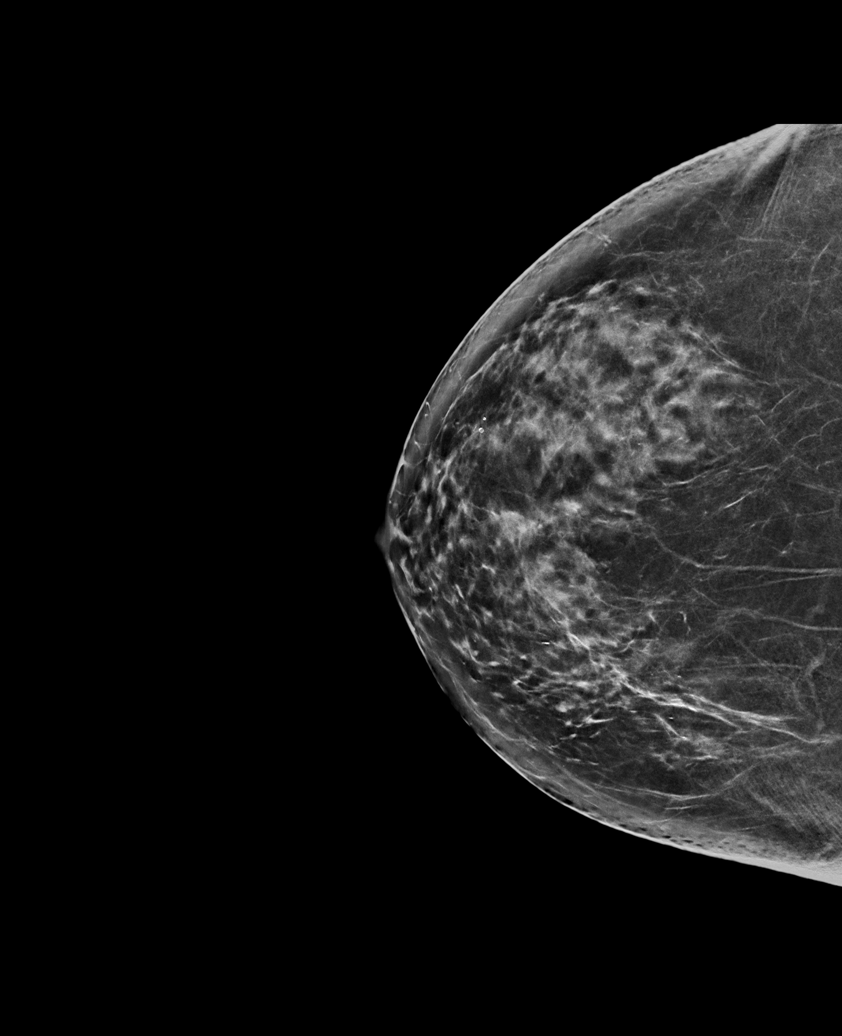

[L MLO synth-2D]
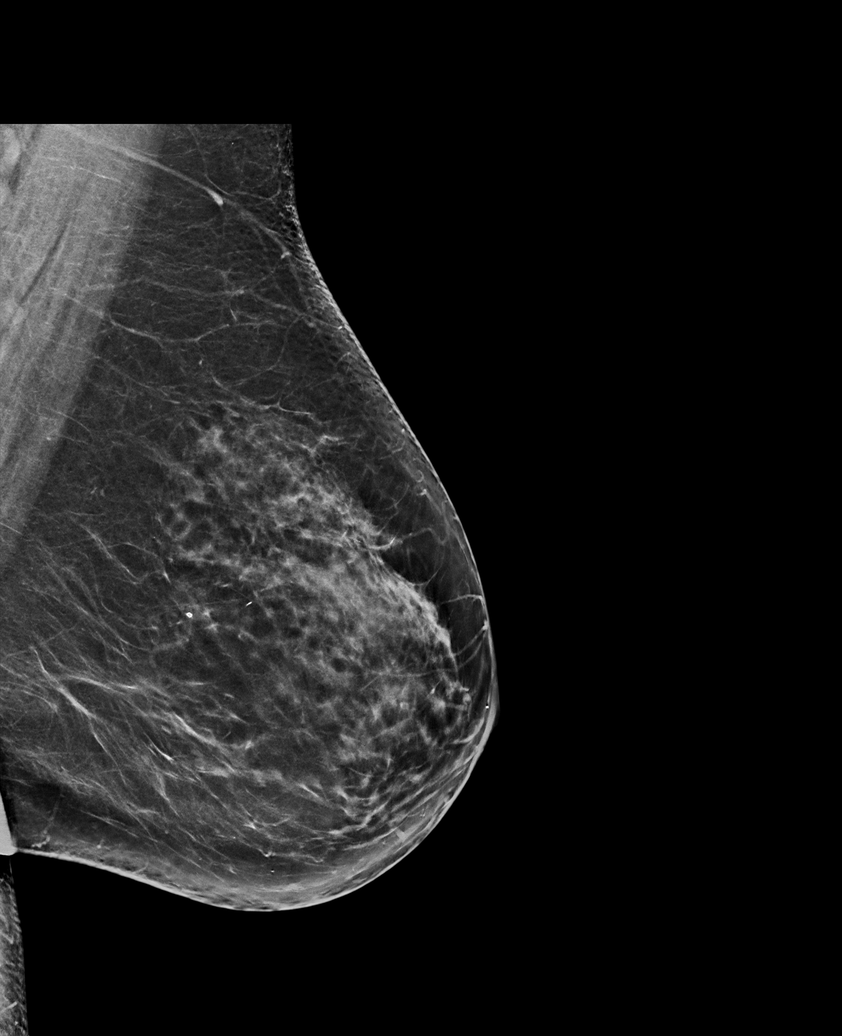

[R MLO synth-2D (1 of 2)]
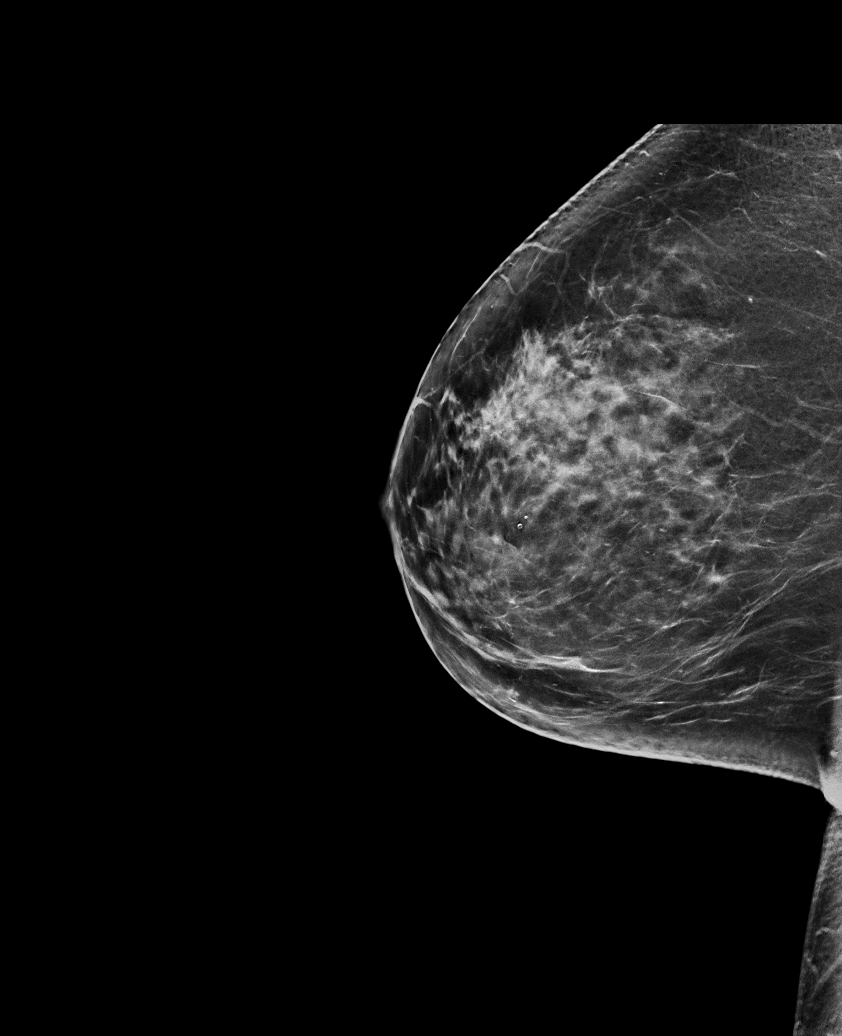

[L CC synth-2D]
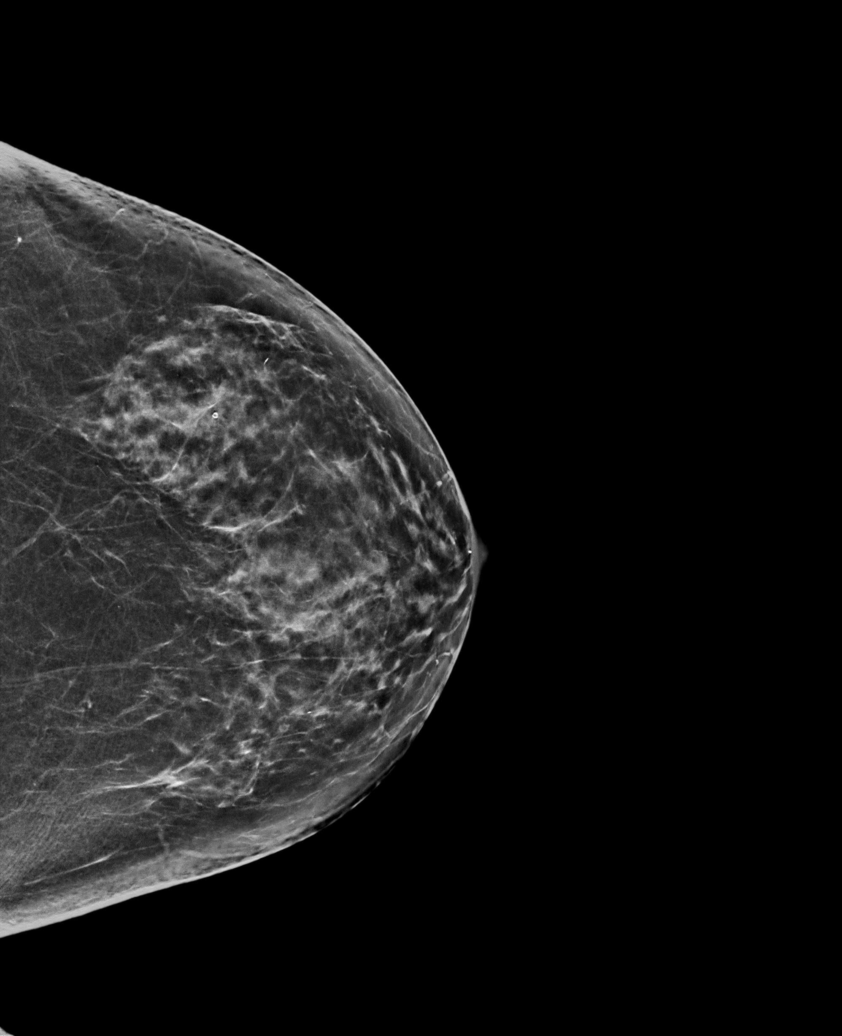

[R MLO synth-2D (2 of 2)]
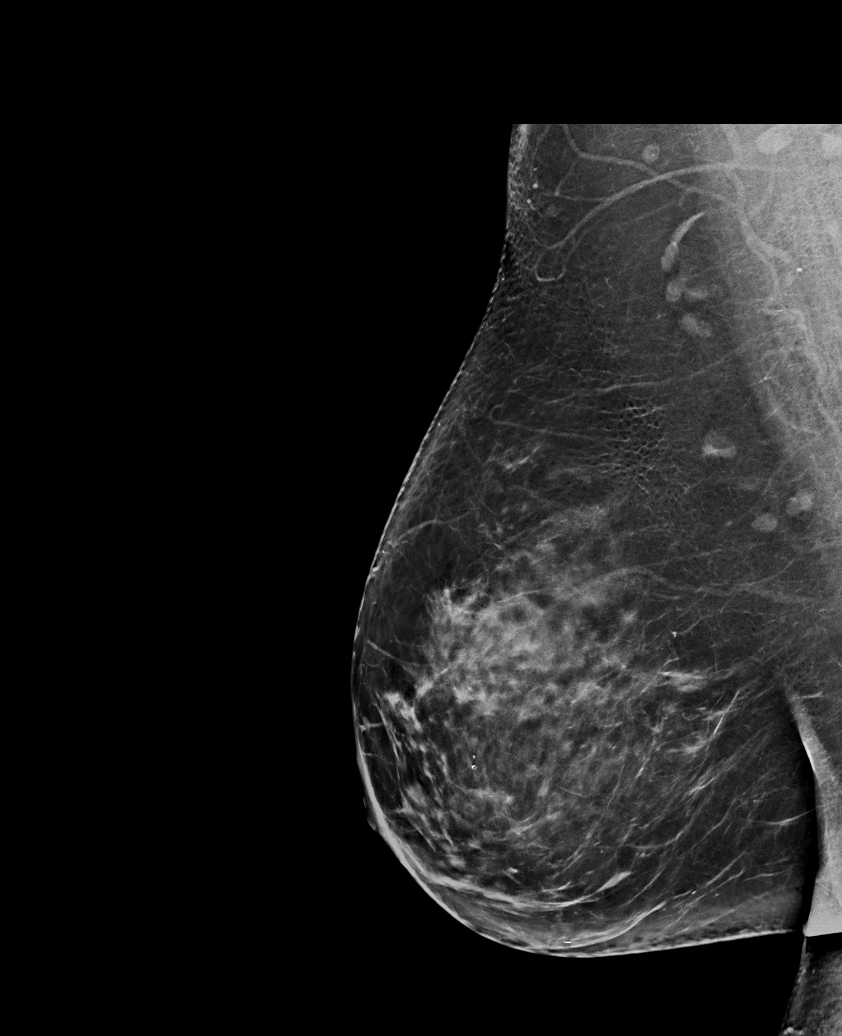

[L CC tomo · tomo slice 36/71.0]
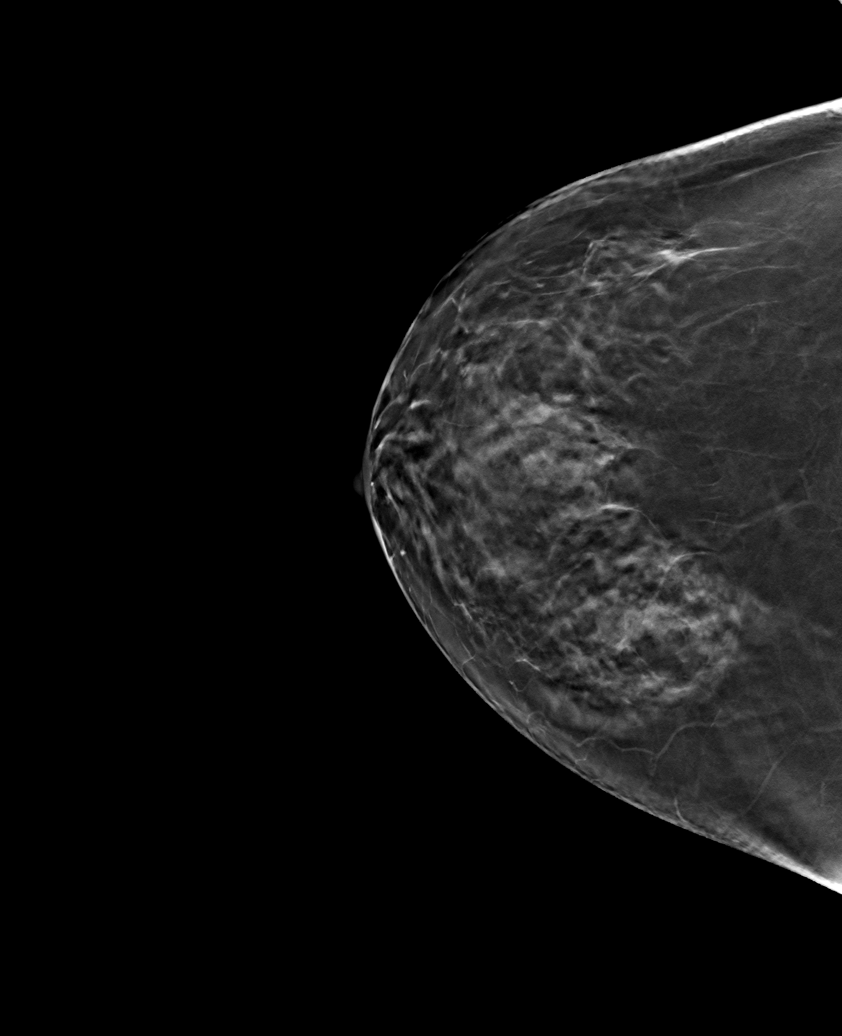

[6 of 30 positions shown; findings below may reference images not displayed]

ACR Breast Density Category c: The breast tissue is heterogeneously
dense, which may obscure small masses.
FINDINGS: There are no findings suspicious for malignancy. Images were
processed with CAD.
IMPRESSION: No mammographic evidence of malignancy. A result letter of this
screening mammogram will be mailed directly to the patient.

RECOMMENDATION:
Screening mammogram in one year. (Code:FT-U-LHB)

BI-RADS CATEGORY  1: Negative.
